# Patient Record
Sex: Male | Born: 1940 | Race: White | Hispanic: No | Smoking: Former smoker
Health system: Southern US, Community
[De-identification: ages and names within clinical notes are randomized; demographics above are authoritative.]

## PROBLEM LIST (undated history)

## (undated) DIAGNOSIS — H409 Unspecified glaucoma: Secondary | ICD-10-CM

## (undated) DIAGNOSIS — I10 Essential (primary) hypertension: Secondary | ICD-10-CM

## (undated) DIAGNOSIS — I2510 Atherosclerotic heart disease of native coronary artery without angina pectoris: Secondary | ICD-10-CM

## (undated) DIAGNOSIS — E785 Hyperlipidemia, unspecified: Secondary | ICD-10-CM

## (undated) DIAGNOSIS — I700 Atherosclerosis of aorta: Secondary | ICD-10-CM

## (undated) DIAGNOSIS — C61 Malignant neoplasm of prostate: Secondary | ICD-10-CM

## (undated) HISTORY — DX: Essential (primary) hypertension: I10

## (undated) HISTORY — DX: Hyperlipidemia, unspecified: E78.5

## (undated) HISTORY — PX: CORONARY ANGIOPLASTY WITH STENT PLACEMENT: SHX49

## (undated) HISTORY — DX: Atherosclerotic heart disease of native coronary artery without angina pectoris: I25.10

## (undated) HISTORY — PX: VEIN SURGERY: SHX48

## (undated) HISTORY — DX: Malignant neoplasm of prostate: C61

## (undated) HISTORY — DX: Atherosclerosis of aorta: I70.0

## (undated) HISTORY — PX: CORNEAL TRANSPLANT: SHX108

---

## 2015-01-02 HISTORY — PX: PROSTATECTOMY: SHX69

## 2018-01-06 DIAGNOSIS — Z7901 Long term (current) use of anticoagulants: Secondary | ICD-10-CM | POA: Diagnosis not present

## 2018-01-06 DIAGNOSIS — R1084 Generalized abdominal pain: Secondary | ICD-10-CM | POA: Diagnosis not present

## 2018-01-06 DIAGNOSIS — E7800 Pure hypercholesterolemia, unspecified: Secondary | ICD-10-CM | POA: Diagnosis not present

## 2018-01-06 DIAGNOSIS — I10 Essential (primary) hypertension: Secondary | ICD-10-CM | POA: Diagnosis not present

## 2018-01-06 DIAGNOSIS — R32 Unspecified urinary incontinence: Secondary | ICD-10-CM | POA: Diagnosis not present

## 2018-01-06 DIAGNOSIS — Z955 Presence of coronary angioplasty implant and graft: Secondary | ICD-10-CM | POA: Diagnosis not present

## 2018-01-06 DIAGNOSIS — E669 Obesity, unspecified: Secondary | ICD-10-CM | POA: Diagnosis not present

## 2018-01-06 DIAGNOSIS — Z6832 Body mass index (BMI) 32.0-32.9, adult: Secondary | ICD-10-CM | POA: Diagnosis not present

## 2018-01-06 DIAGNOSIS — S301XXA Contusion of abdominal wall, initial encounter: Secondary | ICD-10-CM | POA: Diagnosis not present

## 2018-01-06 DIAGNOSIS — I700 Atherosclerosis of aorta: Secondary | ICD-10-CM | POA: Diagnosis not present

## 2018-01-06 DIAGNOSIS — Z8546 Personal history of malignant neoplasm of prostate: Secondary | ICD-10-CM | POA: Diagnosis not present

## 2018-01-06 DIAGNOSIS — N200 Calculus of kidney: Secondary | ICD-10-CM | POA: Diagnosis not present

## 2018-01-07 DIAGNOSIS — H401131 Primary open-angle glaucoma, bilateral, mild stage: Secondary | ICD-10-CM | POA: Diagnosis not present

## 2018-01-27 DIAGNOSIS — C61 Malignant neoplasm of prostate: Secondary | ICD-10-CM | POA: Diagnosis not present

## 2018-01-27 DIAGNOSIS — R972 Elevated prostate specific antigen [PSA]: Secondary | ICD-10-CM | POA: Diagnosis not present

## 2018-01-27 DIAGNOSIS — N3289 Other specified disorders of bladder: Secondary | ICD-10-CM | POA: Diagnosis not present

## 2018-01-31 DIAGNOSIS — I10 Essential (primary) hypertension: Secondary | ICD-10-CM

## 2018-01-31 DIAGNOSIS — C61 Malignant neoplasm of prostate: Secondary | ICD-10-CM

## 2018-01-31 DIAGNOSIS — I119 Hypertensive heart disease without heart failure: Secondary | ICD-10-CM | POA: Insufficient documentation

## 2018-01-31 DIAGNOSIS — I2510 Atherosclerotic heart disease of native coronary artery without angina pectoris: Secondary | ICD-10-CM

## 2018-01-31 DIAGNOSIS — I700 Atherosclerosis of aorta: Secondary | ICD-10-CM

## 2018-01-31 DIAGNOSIS — E785 Hyperlipidemia, unspecified: Secondary | ICD-10-CM

## 2018-01-31 HISTORY — DX: Atherosclerosis of aorta: I70.0

## 2018-01-31 HISTORY — DX: Hyperlipidemia, unspecified: E78.5

## 2018-01-31 HISTORY — DX: Essential (primary) hypertension: I10

## 2018-01-31 HISTORY — DX: Atherosclerotic heart disease of native coronary artery without angina pectoris: I25.10

## 2018-01-31 HISTORY — DX: Malignant neoplasm of prostate: C61

## 2018-02-04 NOTE — Progress Notes (Signed)
Cardiology Office Note:    Date:  02/05/2018   ID:  Russell, Thomas 02-04-1940, MRN [id number]  PCP:  Lind Guest, NP  Cardiologist:  Shirlee More, MD   Referring MD: Lind Guest, NP  ASSESSMENT:    1. Coronary artery disease of native artery of native heart with stable angina pectoris ([place])   2. Atrial fibrillation, chronic   3. Chronic anticoagulation   4. Hypertensive heart disease without heart failure   5. Hyperlipidemia, unspecified hyperlipidemia type    PLAN:    In order of problems listed above:  1. CAD is clinically stable having no anginal discomfort but records are reviewed that he had multivessel CAD is at high risk of progressive disease and I think he should have an ischemia evaluation we discussed he agrees and will have a stress myocardial perfusion study.  If he has objective evidence of ischemia would benefit from further revascularization especially with severe left ventricular dysfunction.  We will also check echocardiogram if EF is truly severely reduced less than 35 to 40% we will transition from ACE inhibitor to [facility] start MRA needed discussion about the benefits of ICD. 2. Stable rate controlled without suppressant therapy unfortunately finances will force him to transition from Eliquis to warfarin we will give him samples to carry for 2 weeks until he is got about Pharm.D. 3. See above 4. Continue current medications await EF myocardial perfusion study likely discontinue his amlodipine and optimize treatment if EF remains less than 35 to 40% 5. Continue pravastatin  Next appointment 6 weeks   Medication Adjustments/Labs and Tests Ordered: Current medicines are reviewed at length with the patient today.  Concerns regarding medicines are outlined above.  No orders of the defined types were placed in this encounter.  No orders of the defined types were placed in this encounter.    No chief complaint on file.   History of Present Illness:      Thomas Russell is a 78 y.o. male who is being seen today for the evaluation of CAD at the request of Lind Guest, NP. Unfortunately is unable to review his records prior to the visit will allow the patient was present as able to access through care everywhere May 2017 he presented to the hospital in pulmonary edema rapid atrial fibrillation had non-ST elevation MI and had severely reduced ejection fraction coronary angiography showed multivessel CAD what was described as 50% left main proximal LAD and severe proximal right coronary artery he had PCI and stent to the right coronary.  Echocardiogram May 2018 shows an ejection fraction of 25 to 30% moderately to severely dilated left ventricle.  The gentleman is never had chest pain and despite severe left ventricular dysfunction does not have exercise intolerance edema shortness of breath palpitations syncope or TIA.  Unfortunately cannot afford Eliquis will give him samples to carry him until he seen by the Pharm.D. and we will refer him to the warfarin clinic he understands a higher risk of bleeding dietary restriction and need for frequent lab work at least in the beginning.  He has not had an ischemia evaluation that I can access with in his records.  He was unaware that he had multivessel CAD and severe left ventricular dysfunction.  His medical regimen consist of thiazide diuretic ACE inhibitor furosemide a apixaban and calcium channel blocker for hypertension he is not on beta-blocker.  Past Medical History:  Diagnosis Date  . Aortic atherosclerosis ([place]) 01/31/2018  . CAD (coronary  artery disease) 01/31/2018  . Hyperlipidemia 01/31/2018  . Hypertension 01/31/2018  . Prostate cancer ([place]) 01/31/2018    Past Surgical History:  Procedure Laterality Date  . CORONARY ANGIOPLASTY WITH STENT PLACEMENT    . VEIN SURGERY      Current Medications: Current Meds  Medication Sig  . amLODipine (NORVASC) 5 MG tablet Take 1 tablet by mouth daily.  Marland Kitchen  apixaban (ELIQUIS) 5 MG TABS tablet Take 5 mg by mouth 2 (two) times daily.  . bicalutamide (CASODEX) 50 MG tablet Take 50 mg by mouth daily.  . dorzolamide-timolol (COSOPT) 22.3-6.8 MG/ML ophthalmic solution Place 1 drop into both eyes 2 (two) times daily.  . fluorometholone (FML) 0.1 % ophthalmic suspension Place 1 drop into both eyes daily.  . furosemide (LASIX) 20 MG tablet Take 20 mg by mouth daily.  . hydrochlorothiazide (HYDRODIURIL) 12.5 MG tablet Take 12.5 mg by mouth daily.  Marland Kitchen latanoprost (XALATAN) 0.005 % ophthalmic solution Apply 1 drop to eye daily.  Marland Kitchen lisinopril (PRINIVIL,ZESTRIL) 20 MG tablet Take 20 mg by mouth daily.  . pravastatin (PRAVACHOL) 40 MG tablet Take 40 mg by mouth daily.     Allergies:   Alphagan [brimonidine]   Social History   Socioeconomic History  . Marital status: Unknown    Spouse name: Not on file  . Number of children: Not on file  . Years of education: Not on file  . Highest education level: Not on file  Occupational History  . Not on file  Social Needs  . Financial resource strain: Not on file  . Food insecurity:    Worry: Not on file    Inability: Not on file  . Transportation needs:    Medical: Not on file    Non-medical: Not on file  Tobacco Use  . Smoking status: Former Research scientist (life sciences)  . Smokeless tobacco: Former Network engineer and Sexual Activity  . Alcohol use: Not Currently  . Drug use: Never  . Sexual activity: Not on file  Lifestyle  . Physical activity:    Days per week: Not on file    Minutes per session: Not on file  . Stress: Not on file  Relationships  . Social connections:    Talks on phone: Not on file    Gets together: Not on file    Attends religious service: Not on file    Active member of club or organization: Not on file    Attends meetings of clubs or organizations: Not on file    Relationship status: Not on file  Other Topics Concern  . Not on file  Social History Narrative  . Not on file     Family  History: The patient's family history includes Cancer in his sister.  ROS:   Review of Systems  Constitution: Negative.  HENT: Negative.   Eyes: Negative.   Cardiovascular: Negative.   Respiratory: Negative.   Endocrine: Negative.   Hematologic/Lymphatic: Negative.   Skin: Negative.   Musculoskeletal: Negative.   Gastrointestinal: Negative.   Genitourinary: Negative.   Neurological: Negative.   Psychiatric/Behavioral: Negative.   Allergic/Immunologic: Negative.    Please see the history of present illness.     All other systems reviewed and are negative.  EKGs/Labs/Other Studies Reviewed:    The following studies were reviewed today:   EKG:  EKG is  ordered today.  The ekg ordered today demonstrates shows rate controlled atrial fibrillation ischemic T wave inversion 1 PVC  Recent Labs:   01/16/18 Hgb 12.3 plt  212000 No results found for requested labs within last 8760 hours.  Recent Lipid Panel No results found for: CHOL, TRIG, HDL, CHOLHDL, VLDL, LDLCALC, LDLDIRECT  Physical Exam:    VS:  BP (!) 136/50 (BP Location: Left Arm, Patient Position: Sitting, Cuff Size: Normal)   Pulse 67   Ht 6\' 1"  (1.854 m)   Wt 225 lb (102.1 kg)   SpO2 98%   BMI 29.69 kg/m     Wt Readings from Last 3 Encounters:  02/05/18 225 lb (102.1 kg)     GEN:  Well nourished, well developed in no acute distress HEENT: Normal NECK: No JVD; No carotid bruits LYMPHATICS: No lymphadenopathy CARDIAC: Irr irr variable s1  no murmurs, rubs, gallops RESPIRATORY:  Clear to auscultation without rales, wheezing or rhonchi  ABDOMEN: Soft, non-tender, non-distended MUSCULOSKELETAL:  No edema; No deformity  SKIN: Warm and dry NEUROLOGIC:  Alert and oriented x 3 PSYCHIATRIC:  Normal affect     Signed, Shirlee More, MD  02/05/2018 9:11 AM    [place]

## 2018-02-05 ENCOUNTER — Ambulatory Visit: Payer: Medicare HMO | Admitting: Cardiology

## 2018-02-05 ENCOUNTER — Encounter: Payer: Self-pay | Admitting: Cardiology

## 2018-02-05 VITALS — BP 136/50 | HR 67 | Ht 73.0 in | Wt 225.0 lb

## 2018-02-05 DIAGNOSIS — Z7901 Long term (current) use of anticoagulants: Secondary | ICD-10-CM | POA: Diagnosis not present

## 2018-02-05 DIAGNOSIS — E785 Hyperlipidemia, unspecified: Secondary | ICD-10-CM

## 2018-02-05 DIAGNOSIS — I482 Chronic atrial fibrillation, unspecified: Secondary | ICD-10-CM | POA: Diagnosis not present

## 2018-02-05 DIAGNOSIS — I119 Hypertensive heart disease without heart failure: Secondary | ICD-10-CM | POA: Diagnosis not present

## 2018-02-05 DIAGNOSIS — I25118 Atherosclerotic heart disease of native coronary artery with other forms of angina pectoris: Secondary | ICD-10-CM

## 2018-02-05 MED ORDER — APIXABAN 5 MG PO TABS: 5.0000 mg | ORAL_TABLET | Freq: Two times a day (BID) | ORAL | 2 refills | Status: DC

## 2018-02-05 MED ORDER — AMLODIPINE BESYLATE 5 MG PO TABS: 5.0000 mg | ORAL_TABLET | Freq: Every day | ORAL | 2 refills | Status: AC

## 2018-02-05 MED ORDER — PRAVASTATIN SODIUM 40 MG PO TABS: 40.0000 mg | ORAL_TABLET | Freq: Every day | ORAL | 2 refills | Status: AC

## 2018-02-05 MED ORDER — FUROSEMIDE 20 MG PO TABS: 20.0000 mg | ORAL_TABLET | Freq: Every day | ORAL | 2 refills | Status: AC

## 2018-02-05 MED ORDER — LISINOPRIL 20 MG PO TABS: 20.0000 mg | ORAL_TABLET | Freq: Every day | ORAL | 2 refills | Status: AC

## 2018-02-05 MED ORDER — HYDROCHLOROTHIAZIDE 12.5 MG PO TABS: 12.5000 mg | ORAL_TABLET | Freq: Every day | ORAL | 3 refills | Status: AC

## 2018-02-05 NOTE — Patient Instructions (Signed)
Medication Instructions:   Your physician recommends that you continue on your current medications as directed. Please refer to the Current Medication list given to you today.   If you need a refill on your cardiac medications before your next appointment, please call your pharmacy.   Lab work:  NONE  If you have labs (blood work) drawn today and your tests are completely normal, you will receive your results only by: Marland Kitchen MyChart Message (if you have MyChart) OR . A paper copy in the mail If you have any lab test that is abnormal or we need to change your treatment, we will call you to review the results.  Testing/Procedures:  Your physician has requested that you have en exercise stress myoview. For further information please visit [email or web address]. Please follow instruction sheet, as given.  Your physician has requested that you have an echocardiogram. Echocardiography is a painless test that uses sound waves to create images of your heart. It provides your doctor with information about the size and shape of your heart and how well your heart's chambers and valves are working. This procedure takes approximately one hour. There are no restrictions for this procedure.    Follow-Up: At [facility], you and your health needs are our priority.  As part of our continuing mission to provide you with exceptional heart care, we have created designated Provider Care Teams.  These Care Teams include your primary Cardiologist (physician) and Advanced Practice Providers (APPs -  Physician Assistants and Nurse Practitioners) who all work together to provide you with the care you need, when you need it.  You will need a follow up appointment in 6 weeks.

## 2018-02-05 NOTE — Addendum Note (Signed)
Addended by: Orland Penman on: 02/05/2018 10:39 AM   Modules accepted: Orders

## 2018-02-11 ENCOUNTER — Telehealth: Payer: Self-pay | Admitting: Cardiology

## 2018-02-11 NOTE — Telephone Encounter (Signed)
Has questions about starting Warfrin

## 2018-02-11 NOTE — Telephone Encounter (Signed)
Called patient to discuss changing his anticoagulation from eliquis to coumadin due to finances. During his last office visit on 02/05/2018, Dr. Bettina Gavia states that patient should be set up with our coumadin clinic to transition to coumadin. Patient has eliquis samples to get him to Wednesday, 02/19/2018. Patient has been scheduled to see the coumadin clinic on Tuesday, 02/18/2018. Patient will start coumadin 5 mg daily per Dr. Bettina Gavia as instructed below.  Spoke with Tula Nakayama with the coumadin clinic to discuss further instructions. Patient is to take eliquis and coumadin 5 mg together on Friday evening, 02/14/2018, Saturday, 02/15/2018, and Sunday, 02/16/2018. Patient will continue to take eliquis 5 mg in the morning as well. Patient instructed to discontinue eliquis on Sunday after his evening dose. He will only take 5 mg of coumadin on Monday, 02/17/2018, and will recheck his INR on Tuesday, 02/18/2018, per Tula Nakayama, RN. Patient verbalized understanding and requested a copy of these instructions to be faxed to the [facility] office for him to pick up. Will arrange. No further questions.

## 2018-02-12 ENCOUNTER — Telehealth: Payer: Self-pay | Admitting: Cardiology

## 2018-02-12 MED ORDER — WARFARIN SODIUM 5 MG PO TABS: 5.0000 mg | ORAL_TABLET | Freq: Every day | ORAL | 1 refills | Status: DC

## 2018-02-12 NOTE — Telephone Encounter (Signed)
°*  STAT* If patient is at the pharmacy, call can be transferred to refill team.   1. Which medications need to be refilled? (please list name of each medication and dose if known) Warfarin   2. Which pharmacy/location (including street and city if local pharmacy) is medication to be sent to?[facility] [address]  3. Do they need a 30 day or 90 day supply? [place]

## 2018-02-12 NOTE — Telephone Encounter (Signed)
Rx sent in for wafarin 5mg  one tablet daily as directed by Dr Joya Gaskins verbal order.  This is new start for warfarin, and will be managed by Coumadin Clinic.

## 2018-02-18 ENCOUNTER — Ambulatory Visit (INDEPENDENT_AMBULATORY_CARE_PROVIDER_SITE_OTHER): Payer: Medicare HMO | Admitting: *Deleted

## 2018-02-18 DIAGNOSIS — Z7901 Long term (current) use of anticoagulants: Secondary | ICD-10-CM | POA: Diagnosis not present

## 2018-02-18 DIAGNOSIS — I482 Chronic atrial fibrillation, unspecified: Secondary | ICD-10-CM

## 2018-02-18 LAB — POCT INR: INR: 2.1 (ref 2.0–3.0)

## 2018-02-18 NOTE — Patient Instructions (Addendum)
A full discussion of the nature of anticoagulants has been carried out.  A benefit risk analysis has been presented to the patient, so that they understand the justification for choosing anticoagulation at this time. The need for frequent and regular monitoring, precise dosage adjustment and compliance is stressed.  Side effects of potential bleeding are discussed.  The patient should avoid any OTC items containing aspirin or ibuprofen, and should avoid great swings in general diet.  Avoid alcohol consumption.  Call if any signs of abnormal bleeding.   Description   Start taking 1 pill everyday except 1/2 pill  On Wednesday and Saturday. Recheck in one week. Call us with any medication changes or concerns # [phone]

## 2018-02-24 DIAGNOSIS — E785 Hyperlipidemia, unspecified: Secondary | ICD-10-CM | POA: Diagnosis not present

## 2018-02-24 DIAGNOSIS — I739 Peripheral vascular disease, unspecified: Secondary | ICD-10-CM | POA: Diagnosis not present

## 2018-02-24 DIAGNOSIS — G3184 Mild cognitive impairment, so stated: Secondary | ICD-10-CM | POA: Diagnosis not present

## 2018-02-24 DIAGNOSIS — C61 Malignant neoplasm of prostate: Secondary | ICD-10-CM | POA: Diagnosis not present

## 2018-02-24 DIAGNOSIS — K08409 Partial loss of teeth, unspecified cause, unspecified class: Secondary | ICD-10-CM | POA: Diagnosis not present

## 2018-02-24 DIAGNOSIS — I252 Old myocardial infarction: Secondary | ICD-10-CM | POA: Diagnosis not present

## 2018-02-24 DIAGNOSIS — H409 Unspecified glaucoma: Secondary | ICD-10-CM | POA: Diagnosis not present

## 2018-02-24 DIAGNOSIS — I10 Essential (primary) hypertension: Secondary | ICD-10-CM | POA: Diagnosis not present

## 2018-02-24 DIAGNOSIS — N529 Male erectile dysfunction, unspecified: Secondary | ICD-10-CM | POA: Diagnosis not present

## 2018-02-24 DIAGNOSIS — E669 Obesity, unspecified: Secondary | ICD-10-CM | POA: Diagnosis not present

## 2018-02-25 ENCOUNTER — Ambulatory Visit (INDEPENDENT_AMBULATORY_CARE_PROVIDER_SITE_OTHER): Payer: Medicare HMO

## 2018-02-25 ENCOUNTER — Ambulatory Visit (INDEPENDENT_AMBULATORY_CARE_PROVIDER_SITE_OTHER): Payer: Medicare HMO | Admitting: Pharmacist Clinician (PhC)/ Clinical Pharmacy Specialist

## 2018-02-25 DIAGNOSIS — Z7901 Long term (current) use of anticoagulants: Secondary | ICD-10-CM

## 2018-02-25 DIAGNOSIS — I482 Chronic atrial fibrillation, unspecified: Secondary | ICD-10-CM

## 2018-02-25 LAB — POCT INR: INR: 4 — AB (ref 2.0–3.0)

## 2018-02-25 NOTE — Patient Instructions (Signed)
No warfarin today Tuesday Feb 25, then take 1 tablet daily except 1/2 tablet each Monday, Wednesday and Friday.   Recheck in one week. Call us with any medication changes or concerns # [phone]

## 2018-03-04 ENCOUNTER — Telehealth: Payer: Self-pay | Admitting: *Deleted

## 2018-03-04 ENCOUNTER — Ambulatory Visit (INDEPENDENT_AMBULATORY_CARE_PROVIDER_SITE_OTHER): Payer: Medicare HMO | Admitting: Pharmacist

## 2018-03-04 DIAGNOSIS — I482 Chronic atrial fibrillation, unspecified: Secondary | ICD-10-CM

## 2018-03-04 DIAGNOSIS — Z7901 Long term (current) use of anticoagulants: Secondary | ICD-10-CM | POA: Diagnosis not present

## 2018-03-04 LAB — POCT INR: INR: 3.8 — AB (ref 2.0–3.0)

## 2018-03-04 NOTE — Telephone Encounter (Signed)
Patient given detailed instructions per Myocardial Perfusion Study Information Sheet for the test on 03/11/18 at 0800. Patient notified to arrive 15 minutes early and that it is imperative to arrive on time for appointment to keep from having the test rescheduled.  If you need to cancel or reschedule your appointment, please call the office within 24 hours of your appointment. . Patient verbalized understanding.Thomas Russell, Ranae Palms

## 2018-03-04 NOTE — Patient Instructions (Signed)
Description   No warfarin today March 3rd, then start taking 1/2 tablet daily except 1 on Sundays.   Recheck in one week. Call us with any medication changes or concerns # [phone]

## 2018-03-11 ENCOUNTER — Ambulatory Visit (INDEPENDENT_AMBULATORY_CARE_PROVIDER_SITE_OTHER): Payer: Medicare HMO

## 2018-03-11 ENCOUNTER — Telehealth: Payer: Self-pay

## 2018-03-11 ENCOUNTER — Ambulatory Visit (INDEPENDENT_AMBULATORY_CARE_PROVIDER_SITE_OTHER): Payer: Medicare HMO | Admitting: *Deleted

## 2018-03-11 VITALS — Ht 73.0 in | Wt 225.0 lb

## 2018-03-11 DIAGNOSIS — I482 Chronic atrial fibrillation, unspecified: Secondary | ICD-10-CM

## 2018-03-11 DIAGNOSIS — Z7901 Long term (current) use of anticoagulants: Secondary | ICD-10-CM

## 2018-03-11 DIAGNOSIS — I25118 Atherosclerotic heart disease of native coronary artery with other forms of angina pectoris: Secondary | ICD-10-CM

## 2018-03-11 DIAGNOSIS — I119 Hypertensive heart disease without heart failure: Secondary | ICD-10-CM | POA: Diagnosis not present

## 2018-03-11 DIAGNOSIS — E785 Hyperlipidemia, unspecified: Secondary | ICD-10-CM

## 2018-03-11 DIAGNOSIS — I2510 Atherosclerotic heart disease of native coronary artery without angina pectoris: Secondary | ICD-10-CM

## 2018-03-11 LAB — MYOCARDIAL PERFUSION IMAGING
Peak HR: 93 {beats}/min
Rest HR: 59 {beats}/min
SDS: 10
SRS: 7
SSS: 17
TID: 1.02

## 2018-03-11 LAB — ECHOCARDIOGRAM COMPLETE
Height: 73 in
Weight: 3600 oz

## 2018-03-11 LAB — POCT INR: INR: 2.4 (ref 2.0–3.0)

## 2018-03-11 MED ORDER — REGADENOSON 0.4 MG/5ML IV SOLN
0.4000 mg | Freq: Once | INTRAVENOUS | Status: AC
  Administered 2018-03-11: 0.4 mg via INTRAVENOUS

## 2018-03-11 MED ORDER — TECHNETIUM TC 99M TETROFOSMIN IV KIT
32.6000 | PACK | Freq: Once | INTRAVENOUS | Status: AC | PRN
  Administered 2018-03-11: 32.6 via INTRAVENOUS

## 2018-03-11 MED ORDER — TECHNETIUM TC 99M TETROFOSMIN IV KIT
10.9000 | PACK | Freq: Once | INTRAVENOUS | Status: AC | PRN
  Administered 2018-03-11: 10.9 via INTRAVENOUS

## 2018-03-11 NOTE — Progress Notes (Signed)
Complete echocardiogram has been performed.  Jimmy Vidit Boissonneault RDCS, RVT 

## 2018-03-11 NOTE — Telephone Encounter (Signed)
Patient notified of improved echocardiogram results per Dr Bettina Gavia.  Patient advised that myoview does show some ischemia, and Dr Bettina Gavia would like to discuss these results in more detail at next appointment.  Patient advised to keep follow up appointment for 03-26-2018. Patient agreed to plan and verbalized understanding.

## 2018-03-11 NOTE — Patient Instructions (Signed)
Description   Continue  taking 1/2 tablet daily except 1 tablet on Sundays.   Recheck in 2 weeks. Call us with any medication changes or concerns # [phone]

## 2018-03-12 ENCOUNTER — Other Ambulatory Visit: Payer: Medicare HMO

## 2018-03-13 DIAGNOSIS — N393 Stress incontinence (female) (male): Secondary | ICD-10-CM | POA: Diagnosis not present

## 2018-03-13 DIAGNOSIS — C61 Malignant neoplasm of prostate: Secondary | ICD-10-CM | POA: Diagnosis not present

## 2018-03-19 ENCOUNTER — Other Ambulatory Visit (HOSPITAL_COMMUNITY): Payer: Self-pay | Admitting: Urology

## 2018-03-19 DIAGNOSIS — C61 Malignant neoplasm of prostate: Secondary | ICD-10-CM

## 2018-03-25 ENCOUNTER — Other Ambulatory Visit: Payer: Self-pay

## 2018-03-25 ENCOUNTER — Ambulatory Visit (INDEPENDENT_AMBULATORY_CARE_PROVIDER_SITE_OTHER): Payer: Medicare HMO | Admitting: Pharmacist

## 2018-03-25 DIAGNOSIS — I482 Chronic atrial fibrillation, unspecified: Secondary | ICD-10-CM | POA: Diagnosis not present

## 2018-03-25 DIAGNOSIS — Z7901 Long term (current) use of anticoagulants: Secondary | ICD-10-CM | POA: Diagnosis not present

## 2018-03-25 LAB — POCT INR: INR: 1.9 — AB (ref 2.0–3.0)

## 2018-03-25 NOTE — Patient Instructions (Addendum)
Description   Take 1 tablet today then continue Continue  taking 1/2 tablet daily except 1 tablet on Sundays.   Recheck in 2 weeks. Call us with any medication changes or concerns # [phone]    Please follow up with primary care doctor about blood in your urine

## 2018-03-26 ENCOUNTER — Telehealth (INDEPENDENT_AMBULATORY_CARE_PROVIDER_SITE_OTHER): Payer: Medicare HMO | Admitting: Cardiology

## 2018-03-26 ENCOUNTER — Telehealth: Payer: Self-pay | Admitting: Cardiology

## 2018-03-26 ENCOUNTER — Encounter: Payer: Self-pay | Admitting: Cardiology

## 2018-03-26 VITALS — BP 145/54 | HR 77 | Ht 72.0 in | Wt 225.0 lb

## 2018-03-26 DIAGNOSIS — Z955 Presence of coronary angioplasty implant and graft: Secondary | ICD-10-CM | POA: Diagnosis not present

## 2018-03-26 DIAGNOSIS — I5020 Unspecified systolic (congestive) heart failure: Secondary | ICD-10-CM

## 2018-03-26 DIAGNOSIS — Z7901 Long term (current) use of anticoagulants: Secondary | ICD-10-CM

## 2018-03-26 DIAGNOSIS — E785 Hyperlipidemia, unspecified: Secondary | ICD-10-CM | POA: Diagnosis not present

## 2018-03-26 DIAGNOSIS — I110 Hypertensive heart disease with heart failure: Secondary | ICD-10-CM

## 2018-03-26 DIAGNOSIS — I25118 Atherosclerotic heart disease of native coronary artery with other forms of angina pectoris: Secondary | ICD-10-CM

## 2018-03-26 DIAGNOSIS — Z79899 Other long term (current) drug therapy: Secondary | ICD-10-CM | POA: Diagnosis not present

## 2018-03-26 DIAGNOSIS — I482 Chronic atrial fibrillation, unspecified: Secondary | ICD-10-CM

## 2018-03-26 DIAGNOSIS — I119 Hypertensive heart disease without heart failure: Secondary | ICD-10-CM

## 2018-03-26 DIAGNOSIS — I2510 Atherosclerotic heart disease of native coronary artery without angina pectoris: Secondary | ICD-10-CM

## 2018-03-26 DIAGNOSIS — R0602 Shortness of breath: Secondary | ICD-10-CM

## 2018-03-26 NOTE — Telephone Encounter (Signed)
Documented in patients chart on televisit from today.

## 2018-03-26 NOTE — Progress Notes (Signed)
Virtual Visit via Telephone Note    Evaluation Performed:  Follow-up visit  This visit type was conducted due to national recommendations for restrictions regarding the COVID-19 Pandemic (e.g. social distancing).  This format is felt to be most appropriate for this patient at this time.  All issues noted in this document were discussed and addressed.  No physical exam was performed (except for noted visual exam findings with Video Visits).  Please refer to the patient's chart (MyChart message for video visits and phone note for telephone visits) for the patient's consent to telehealth for [facility].  Date:  03/26/2018   ID:  [name], [name] 1940/04/21, MRN [id number]  Patient Location:  [address] [place] [address]   Provider location:   [place] office  PCP:  Thomas Guest, NP  Cardiologist:  No primary care provider on file.  Dr. Bettina Gavia Electrophysiologist:  None   Chief Complaint: Follow-up atrial fibrillation heart failure CAD  History of Present Illness:    Thomas Russell is a 78 y.o. male who presents via Engineer, civil (consulting) for a telehealth visit today.  He was last seen 02/05/2018  The patient does not symptoms concerning for COVID-19 infection (fever, chills, cough, or new SHORTNESS OF BREATH).  May 2017 he presented to the hospital in pulmonary edema rapid atrial fibrillation had non-ST elevation MI and had severely reduced ejection fraction coronary angiography showed multivessel CAD what was described as 50% left main proximal LAD and severe proximal right coronary artery he had PCI and multiple stent to the right coronary. Echocardiogram May 2018 shows an ejection fraction of 25 to 30% moderately to severely dilated left ventricle.    Prior CV studies:   The following studies were reviewed today: Echocardiogram after that visit shows an ejection fraction 45 to 50% moderately dilated ventricle with moderate concentric left  ventricular hypertrophy.  There is mild to moderate mitral regurgitation mild to moderate aortic regurgitation was noted.  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  On 03/11/2018 he had a myocardial perfusion study performed ejection fraction could not be determined because of PVCs and he had evidence of ischemia in the distribution of the right coronary artery.  He had an INR performed yesterday 1.9 managed on warfarin clinic  Overall he feels good he has mild shortness of breath when he is outdoors doing activities like shopping New York Heart Association class II but no edema orthopnea chest pain palpitation syncope or TIA.  He describes a brief episode of hematuria 2 weeks ago no recurrence.  At this time I would not interrupt his anticoagulant refer to urology with her coronavirus outbreak.  He does sodium restrict said he will attempt to find his blood pressure cuff and if he does will call our office today with heart rate and blood pressure.  I reviewed his abnormal myocardial perfusion study my recommendation to continue medical treatment at this time he is not having angina and his market improvement in left ventricular ejection function function.   Past Medical History:  Diagnosis Date   Aortic atherosclerosis ([place]) 01/31/2018   CAD (coronary artery disease) 01/31/2018   Hyperlipidemia 01/31/2018   Hypertension 01/31/2018   Prostate cancer ([place]) 01/31/2018   Past Surgical History:  Procedure Laterality Date   CORONARY ANGIOPLASTY WITH STENT PLACEMENT     VEIN SURGERY       Current Meds  Medication Sig   amLODipine (NORVASC) 5 MG tablet Take 1  tablet (5 mg total) by mouth daily.   bicalutamide (CASODEX) 50 MG tablet Take 50 mg by mouth daily.   dorzolamide-timolol (COSOPT) 22.3-6.8 MG/ML ophthalmic solution Place 1 drop into both eyes 2 (two) times daily.   fluorometholone (FML) 0.1 % ophthalmic suspension Place 1 drop into both eyes daily.   furosemide (LASIX) 20 MG tablet Take  1 tablet (20 mg total) by mouth daily.   hydrochlorothiazide (HYDRODIURIL) 12.5 MG tablet Take 1 tablet (12.5 mg total) by mouth daily.   latanoprost (XALATAN) 0.005 % ophthalmic solution Apply 1 drop to eye daily.   lisinopril (PRINIVIL,ZESTRIL) 20 MG tablet Take 1 tablet (20 mg total) by mouth daily.   pravastatin (PRAVACHOL) 40 MG tablet Take 1 tablet (40 mg total) by mouth daily.   warfarin (COUMADIN) 5 MG tablet Take 1 tablet (5 mg total) by mouth daily.     Allergies:   Alphagan [brimonidine]   Social History   Tobacco Use   Smoking status: Former Smoker   Smokeless tobacco: Former Network engineer Use Topics   Alcohol use: Not Currently   Drug use: Never     Family Hx: The patient's family history includes Cancer in his sister.  ROS:   Please see the history of present illness.     All other systems reviewed and are negative.   Labs/Other Tests and Data Reviewed:    Recent Labs: No results found for requested labs within last 8760 hours.   Recent Lipid Panel No results found for: CHOL, TRIG, HDL, CHOLHDL, LDLCALC, LDLDIRECT  Wt Readings from Last 3 Encounters:  03/26/18 225 lb (102.1 kg)  03/11/18 225 lb (102.1 kg)  02/05/18 225 lb (102.1 kg)     Exam:    Vital Signs:  Ht 6' (1.829 m)    Wt 225 lb (102.1 kg)    BMI 30.52 kg/m    Unfortunately he is in the process of moving and cannot access his blood pressure cuff he is weighing daily and his weights are down 2 to 3 pounds at home ASSESSMENT & PLAN:    1.  Atrial fibrillation chronic rate is controlled without suppressant therapy hopefully he will call our office of the heart rate today continue his anticoagulation warfarin through our Coumadin clinic if he has recurrent hematuria he will need evaluation to urology despite the current coronavirus outbreak 2.  Chronic anticoagulation continue warfarin see above INR goal 2.5 3.  CAD stable New York Heart Association class I at this time I would not  refer for coronary angiography and continue medical treatment including calcium channel blocker statin and anticoagulant in my opinion he did not have a high risk myocardial perfusion study he had single-vessel ischemia in the distribution of right coronary artery with multiple stents in the past. 4.  Hypertension continue current treatment hopefully will call our office with a home blood pressure goal 1 14-7 40 systolic 5.  Hyperlipidemia stable continue statin with CAD  COVID-19 Education: The signs and symptoms of COVID-19 were discussed with the patient and how to seek care for testing (follow up with PCP or arrange E-visit).  The importance of social distancing was discussed today.  Patient Risk:   After full review of this patients clinical status, I feel that they are at least moderate risk at this time.  Time:   Today, I have spent 23 minutes minutes with the patient with telehealth technology discussing the results of his testing echocardiogram myocardial perfusion study pro time INR yesterday  medical treatment and necessity for him to begin to home monitor blood pressure and heart rate weigh daily he alerted me that he had brief hematuria 2 weeks ago at this time I would not initiate a urologic evaluation until recurrent or interrupt his anticoagulants.  We will time checking labs BMP proBNP when he returns for an INR in 2 weeks to the warfarin clinic.     Medication Adjustments/Labs and Tests Ordered: Current medicines are reviewed at length with the patient today.  Concerns regarding medicines are outlined above.  Tests Ordered: No orders of the defined types were placed in this encounter.  Medication Changes: No orders of the defined types were placed in this encounter.   Disposition:  in 3 month(s)  Signed, Shirlee More, MD  03/26/2018 9:26 AM    [facility]

## 2018-03-26 NOTE — Patient Instructions (Signed)
Medication Instructions:   Your physician recommends that you continue on your current medications as directed. Please refer to the Current Medication list given to you today.  If you need a refill on your cardiac medications before your next appointment, please call your pharmacy.   Lab work: Your physician recommends that you return for lab work on Tuesday 04/15/2018 in the [place] office for your INR check. You will have the following additional labs drawn:  BMP Pro BNP  If you have labs (blood work) drawn today and your tests are completely normal, you will receive your results only by: Marland Kitchen MyChart Message (if you have MyChart) OR . A paper copy in the mail If you have any lab test that is abnormal or we need to change your treatment, we will call you to review the results.  Testing/Procedures:  None  Follow-Up: At [facility], you and your health needs are our priority.  As part of our continuing mission to provide you with exceptional heart care, we have created designated Provider Care Teams.  These Care Teams include your primary Cardiologist (physician) and Advanced Practice Providers (APPs -  Physician Assistants and Nurse Practitioners) who all work together to provide you with the care you need, when you need it. . You will need a follow up appointment in 3 months.   Any Other Special Instructions Will Be Listed Below (If Applicable).

## 2018-03-26 NOTE — Telephone Encounter (Signed)
Patient told to call his BP and pulse back to [place]: 145/54 with pulse of 77 at lunchtime

## 2018-04-04 ENCOUNTER — Other Ambulatory Visit: Payer: Self-pay | Admitting: Cardiology

## 2018-04-08 ENCOUNTER — Other Ambulatory Visit: Payer: Self-pay

## 2018-04-08 ENCOUNTER — Ambulatory Visit (HOSPITAL_COMMUNITY)
Admission: RE | Admit: 2018-04-08 | Discharge: 2018-04-08 | Disposition: A | Discharge status: Home / Self Care | Payer: Medicare HMO | Source: Ambulatory Visit | Attending: Urology | Admitting: Urology

## 2018-04-08 DIAGNOSIS — C61 Malignant neoplasm of prostate: Secondary | ICD-10-CM | POA: Insufficient documentation

## 2018-04-08 MED ORDER — AXUMIN (FLUCICLOVINE F 18) INJECTION
10.6100 | Freq: Once | INTRAVENOUS | Status: AC | PRN
  Administered 2018-04-08: 10.61 via INTRAVENOUS

## 2018-04-14 ENCOUNTER — Telehealth: Payer: Medicare HMO | Admitting: Cardiology

## 2018-04-14 ENCOUNTER — Telehealth: Payer: Self-pay

## 2018-04-14 NOTE — Telephone Encounter (Signed)

## 2018-04-15 ENCOUNTER — Ambulatory Visit (INDEPENDENT_AMBULATORY_CARE_PROVIDER_SITE_OTHER): Payer: Medicare HMO | Admitting: *Deleted

## 2018-04-15 ENCOUNTER — Other Ambulatory Visit: Payer: Self-pay

## 2018-04-15 DIAGNOSIS — Z7901 Long term (current) use of anticoagulants: Secondary | ICD-10-CM | POA: Diagnosis not present

## 2018-04-15 DIAGNOSIS — I482 Chronic atrial fibrillation, unspecified: Secondary | ICD-10-CM | POA: Diagnosis not present

## 2018-04-15 LAB — POCT INR: INR: 2.7 (ref 2.0–3.0)

## 2018-04-15 NOTE — Patient Instructions (Signed)
Description   Spoke with pt and instructed pt to continue taking 1/2 tablet daily except 1 tablet on Sundays.   Recheck in 4 weeks. Call us with any medication changes or concerns # [phone]

## 2018-04-22 DIAGNOSIS — C61 Malignant neoplasm of prostate: Secondary | ICD-10-CM | POA: Diagnosis not present

## 2018-04-22 DIAGNOSIS — C774 Secondary and unspecified malignant neoplasm of inguinal and lower limb lymph nodes: Secondary | ICD-10-CM | POA: Diagnosis not present

## 2018-04-25 ENCOUNTER — Telehealth: Payer: Self-pay

## 2018-04-25 DIAGNOSIS — R310 Gross hematuria: Secondary | ICD-10-CM | POA: Diagnosis not present

## 2018-04-25 DIAGNOSIS — C61 Malignant neoplasm of prostate: Secondary | ICD-10-CM | POA: Diagnosis not present

## 2018-04-25 DIAGNOSIS — N481 Balanitis: Secondary | ICD-10-CM | POA: Diagnosis not present

## 2018-04-25 NOTE — Telephone Encounter (Signed)
Thomas Russell from [facility] calling per Dr. Nila Nephew request to know what anticoagulion therapy is on and what dose. Explained that he's on coumadin which is adjusted frequently by coumadin clinic. Our records may not be up to date, so they should contact [facility] for current dosage. Verbalizes understanding and has no further questions or concerns.

## 2018-04-29 DIAGNOSIS — C775 Secondary and unspecified malignant neoplasm of intrapelvic lymph nodes: Secondary | ICD-10-CM | POA: Diagnosis not present

## 2018-04-29 DIAGNOSIS — C61 Malignant neoplasm of prostate: Secondary | ICD-10-CM | POA: Diagnosis not present

## 2018-05-07 ENCOUNTER — Encounter: Payer: Self-pay | Admitting: Radiation Oncology

## 2018-05-07 NOTE — Progress Notes (Signed)
GU Location of Tumor / Histology: prostatic adenocarcinoma  If Prostate Cancer, Gleason Score is (4 + 5).  Thomas Russell had a radical prostatectomy 08/2015 and was started on Lupron shortly thereafter. Patient completed XRT June 2018. Bicalutamide added but psa 03/2018 was 0.7 so patient was switched to enzulutamide. Recent PET revealed two hypermetabolic lymph nodes.     Past/Anticipated interventions by urology, if any: prostatectomy  Past/Anticipated interventions by medical oncology, if any: Lupron, bicalutamide, enzulutamide. Reports gross hematuria. Reports swollen penile head.  Weight changes, if any: Some weight gain due to being stuck in the house.  Bowel/Bladder complaints, if any: Urinary incontinence requiring him to wear a pad.     Nausea/Vomiting, if any: No  Pain issues, if any: No  SAFETY ISSUES:  Prior radiation? yes, 2018 in [facility]  Pacemaker/ICD? No  Possible current pregnancy? no, male patient  Is the patient on methotrexate?   Current Complaints / other details:  78 year old male. Resides in a senior living facility. Married three times. Third wife deceased. Has one child and two grandchildren. 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 Former smoker and weekend drinker.   Needs radiation to left pelvic side wall.

## 2018-05-09 ENCOUNTER — Ambulatory Visit
Admission: RE | Admit: 2018-05-09 | Discharge: 2018-05-09 | Disposition: A | Discharge status: Home / Self Care | Payer: Medicare HMO | Source: Ambulatory Visit | Attending: Radiation Oncology | Admitting: Radiation Oncology

## 2018-05-09 ENCOUNTER — Encounter: Payer: Self-pay | Admitting: Radiation Oncology

## 2018-05-09 ENCOUNTER — Other Ambulatory Visit: Payer: Self-pay

## 2018-05-09 VITALS — Ht 72.0 in | Wt 225.0 lb

## 2018-05-09 DIAGNOSIS — R9721 Rising PSA following treatment for malignant neoplasm of prostate: Secondary | ICD-10-CM | POA: Diagnosis not present

## 2018-05-09 DIAGNOSIS — Z9079 Acquired absence of other genital organ(s): Secondary | ICD-10-CM | POA: Diagnosis not present

## 2018-05-09 DIAGNOSIS — C61 Malignant neoplasm of prostate: Secondary | ICD-10-CM

## 2018-05-09 DIAGNOSIS — C775 Secondary and unspecified malignant neoplasm of intrapelvic lymph nodes: Secondary | ICD-10-CM

## 2018-05-09 DIAGNOSIS — Z79818 Long term (current) use of other agents affecting estrogen receptors and estrogen levels: Secondary | ICD-10-CM | POA: Diagnosis not present

## 2018-05-09 HISTORY — DX: Unspecified glaucoma: H40.9

## 2018-05-09 NOTE — Progress Notes (Signed)
Radiation Oncology         [phone] ________________________________  Initial Telephone Consultation  Name: Thomas Russell MRN: [id number]  Date: 05/09/2018  DOB: Nov 11, 1940  DG:UYQI, Thomas Lacks, NP  Marice Potter, MD   REFERRING PHYSICIAN: Marice Potter, MD  DIAGNOSIS: 78 y.o. 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    ICD-10-CM   1. Prostate cancer ([place]) C61   2. 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 He has been followed by Dr. Nila Nephew at [facility] for prostate cancer since 2017. He was initially treated with radical rootic assisted prostatectomy in [place] in 08/2015, with pathology revealing Stage IVa (T4 N1 M0) Gleason 4+5 disease involving the left pelvic side wall and 1/4 nodes positive with ECE, positive margins, PNI and LVI with PSA of 22.68 at the time of diagnosis. His initial post-op PSA was 0.09 but given his adverse pathology, he began Lupron shortly after, combined with adjuvant external beam radiation.  Adjuvant XRT to the prostate fossa and pelvic lymph nodes was completed in [place] with Dr. Agnes Lawrence in June 2018.  His PSA initially responded well but unfortunately, over the last 6 months, his PSA became detectable again at 0.49 in 06/2017 so bicalutamide was added to his treatment. Repeat PSA in 03/2018 was further elevated at 0.7 despite biclutamide so he was referred to Dr. Bobby Rumpf and switched to Enzalutamide at that time.  An Axumin PET scan performed on 04/08/2018 revealed: 1.3 cm left internal iliac and 7 mm left external iliac lymph nodes with uptake greater than bone marrow activity, consistent with metastatic disease; but no other sites  of metastatic disease identified.  His most recent PSA on 04/22/2018 shows improvement to 0.3 since starting Enzalutamide and he is tolerating this well in combination with the Lupron.  The patient reviewed the recent labs and PETscan results with Dr. Bobby Rumpf and he has kindly been referred today for discussion of potential further salvage radiation treatment options.  PREVIOUS RADIATION THERAPY: Yes  05/2016 - 06/2016: Prostate Fossa and nodes treated in [place] with Dr. Rosario Jacks  PAST MEDICAL HISTORY:  Past Medical History:  Diagnosis Date  . Aortic atherosclerosis ([place]) 01/31/2018  . CAD (coronary artery disease) 01/31/2018  . Glaucoma   . Hyperlipidemia 01/31/2018  . Hypertension 01/31/2018  . Prostate cancer ([place]) 01/31/2018      PAST SURGICAL HISTORY: Past Surgical History:  Procedure Laterality Date  . CORNEAL TRANSPLANT Bilateral   . CORONARY ANGIOPLASTY WITH STENT PLACEMENT    . PROSTATECTOMY  2017  . VEIN SURGERY      FAMILY HISTORY:  Family History  Problem Relation Age of Onset  . Heart attack Mother   . Heart attack Father   . Cancer Sister     SOCIAL HISTORY:  Social History   Socioeconomic History  . Marital status: Widowed    Spouse name: Not on file  . Number of children: 1  . Years of education: Not on file  . Highest education level: Not on file  Occupational History  . Not on file  Social Needs  . Financial resource strain: Not on file  . Food insecurity:    Worry: Not on file  Inability: Not on file  . Transportation needs:    Medical: No    Non-medical: No  Tobacco Use  . Smoking status: Former Research scientist (life sciences)  . Smokeless tobacco: Former Network engineer and Sexual Activity  . Alcohol use: Not Currently  . Drug use: Never  . Sexual activity: Not Currently  Lifestyle  . Physical activity:    Days per week: Not on file    Minutes per session: Not on file  . Stress: Not on file  Relationships  . Social connections:    Talks on phone: Not on  file    Gets together: Not on file    Attends religious service: Not on file    Active member of club or organization: Not on file    Attends meetings of clubs or organizations: Not on file    Relationship status: Not on file  . Intimate partner violence:    Fear of current or ex partner: Not on file    Emotionally abused: Not on file    Physically abused: Not on file    Forced sexual activity: Not on file  Other Topics Concern  . Not on file  Social History Narrative   Presently resides in a senior living facility. Married three times. Third wife deceased. Has 1 child and two grandchildren. 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     ALLERGIES: Alphagan [brimonidine]  MEDICATIONS:  Current Outpatient Medications  Medication Sig Dispense Refill  . amLODipine (NORVASC) 5 MG tablet Take 1 tablet (5 mg total) by mouth daily. 90 tablet 2  . bicalutamide (CASODEX) 50 MG tablet Take 50 mg by mouth daily.    . clotrimazole-betamethasone (LOTRISONE) cream     . dorzolamide-timolol (COSOPT) 22.3-6.8 MG/ML ophthalmic solution Place 1 drop into both eyes 2 (two) times daily.    . fluorometholone (FML) 0.1 % ophthalmic suspension Place 1 drop into both eyes daily.    . furosemide (LASIX) 20 MG tablet Take 1 tablet (20 mg total) by mouth daily. 90 tablet 2  . hydrochlorothiazide (HYDRODIURIL) 12.5 MG tablet Take 1 tablet (12.5 mg total) by mouth daily. 90 tablet 3  . latanoprost (XALATAN) 0.005 % ophthalmic solution Apply 1 drop to eye daily.    Marland Kitchen lisinopril (PRINIVIL,ZESTRIL) 20 MG tablet Take 1 tablet (20 mg total) by mouth daily. 90 tablet 2  . oxybutynin (DITROPAN) 5 MG tablet Take 5 mg by mouth 3 (three) times daily.    . pravastatin (PRAVACHOL) 40 MG tablet Take 1 tablet (40 mg total) by mouth daily. 90 tablet 2  . warfarin (COUMADIN) 5 MG tablet TAKE 1 TABLET BY MOUTH EVERY DAY 20 tablet 2   No current facility-administered medications for this encounter.     REVIEW OF  SYSTEMS:  On review of systems, the patient reports that he is doing well overall. He denies any chest pain, shortness of breath, cough, fevers, chills, night sweats, unintended weight changes. He denies any bowel disturbances, and denies abdominal pain, nausea or vomiting. He denies any new musculoskeletal or joint aches or pains. He reports gross hematuria, swollen penile head, and urinary incontinence requiring a pad. A complete review of systems is obtained and is otherwise negative.    PHYSICAL EXAM:  Wt Readings from Last 3 Encounters:  05/09/18 225 lb (102.1 kg)  03/26/18 225 lb (102.1 kg)  03/11/18 225 lb (102.1 kg)   Temp Readings from Last 3 Encounters:  No data found for Temp   BP Readings from  Last 3 Encounters:  03/26/18 (!) 145/54  02/05/18 (!) 136/50   Pulse Readings from Last 3 Encounters:  03/26/18 77  02/05/18 67   Pain Assessment Pain Score: 0-No pain/10  Unable to assess due to telephone consult format.  KPS = 90  100 - Normal; no complaints; no evidence of disease. 90   - Able to carry on normal activity; minor signs or symptoms of disease. 80   - Normal activity with effort; some signs or symptoms of disease. 39   - Cares for self; unable to carry on normal activity or to do active work. 60   - Requires occasional assistance, but is able to care for most of his personal needs. 50   - Requires considerable assistance and frequent medical care. 44   - Disabled; requires special care and assistance. 85   - Severely disabled; hospital admission is indicated although death not imminent. 11   - Very sick; hospital admission necessary; active supportive treatment necessary. 10   - Moribund; fatal processes progressing rapidly. 0     - Dead  Karnofsky DA, Abelmann WH, Craver LS and Burchenal JH [phone]) The use of the nitrogen mustards in the palliative treatment of carcinoma: with particular reference to bronchogenic carcinoma Cancer 1 634-56  LABORATORY DATA:   No results found for: WBC, HGB, HCT, MCV, PLT No results found for: NA, K, CL, CO2 No results found for: ALT, AST, GGT, ALKPHOS, BILITOT   RADIOGRAPHY: No results found.    IMPRESSION/PLAN: 1. 78 y.o. 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 Today we reviewed the findings and workup thus far.  We discussed the natural history of metastatic prostate cancer.  We reviewed the the implications of positive margins, extracapsular extension, and lymph node involvement on the risk of prostate cancer recurrence.  We discussed focused stereotactic radiation treatment directed to the pelvic side wall nodes with regard to the logistics and delivery of external beam radiation treatment. We reviewed the potential acute and late side effects associated with this treatment.  He was encouraged to ask questions that were answered to his stated satisfaction.  At the end of the conversation the patient is interested in moving forward with a 5 fraction course of stereotactic body radiation (SBRT) directed at the left pelvic side wall nodes. He is scheduled for simulation on 05/16/2018 at 2:30 pm and we sign formal written consent at that time. We will share our discussion with Drs. Lorre Nick and move forward with treatment planning in anticipation of beginning his SBRT  in the near future.   Given current concerns for patient exposure during the COVID-19 pandemic, this encounter was conducted via telephone. The patient was notified in advance and was offered a [place] meeting to allow for face to face communication but unfortunately reported that he did not have the appropriate resources/technology to support such a visit and instead preferred to proceed with telephone consult. The patient has given verbal consent for this type of encounter. The time spent during this encounter was 30 minutes. The  attendants for this meeting include Tyler Pita MD, Ashlyn Bruning PA-C, [place], and patient Derren Suydam. During the encounter, Tyler Pita MD, Ashlyn Bruning PA-C, and scribe, Wilburn Mylar were located at [place].  Patient, Zebulen Simonis was located at home.    Nicholos Johns, PA-C  Tyler Pita, MD  [facility]  Radiation Oncology Direct Dial: [phone]  Fax: [phone] [email or web address]  Skype  LinkedIn   This document serves as a record of services personally performed by Tyler Pita, MD and Freeman Caldron, PA-C. It was created on their behalf by Wilburn Mylar, a trained medical scribe. The creation of this record is based on the scribe's personal observations and the provider's statements to them. This document has been checked and approved by the attending provider.

## 2018-05-11 DIAGNOSIS — C779 Secondary and unspecified malignant neoplasm of lymph node, unspecified: Secondary | ICD-10-CM | POA: Insufficient documentation

## 2018-05-12 ENCOUNTER — Telehealth: Payer: Self-pay

## 2018-05-12 NOTE — Telephone Encounter (Signed)

## 2018-05-13 ENCOUNTER — Other Ambulatory Visit: Payer: Self-pay

## 2018-05-13 ENCOUNTER — Ambulatory Visit (INDEPENDENT_AMBULATORY_CARE_PROVIDER_SITE_OTHER): Payer: Medicare HMO

## 2018-05-13 DIAGNOSIS — Z7901 Long term (current) use of anticoagulants: Secondary | ICD-10-CM

## 2018-05-13 DIAGNOSIS — I482 Chronic atrial fibrillation, unspecified: Secondary | ICD-10-CM | POA: Diagnosis not present

## 2018-05-13 LAB — POCT INR: INR: 2.6 (ref 2.0–3.0)

## 2018-05-13 NOTE — Patient Instructions (Signed)
Description   Spoke with pt and instructed pt to continue taking 1/2 tablet daily except 1 tablet on Sundays.   Recheck in 4 weeks. Call us with any medication changes or concerns # [phone]

## 2018-05-16 ENCOUNTER — Telehealth: Payer: Self-pay | Admitting: Medical Oncology

## 2018-05-16 ENCOUNTER — Other Ambulatory Visit: Payer: Self-pay

## 2018-05-16 ENCOUNTER — Ambulatory Visit
Admission: RE | Admit: 2018-05-16 | Discharge: 2018-05-16 | Disposition: A | Discharge status: Home / Self Care | Payer: Medicare HMO | Source: Ambulatory Visit | Attending: Radiation Oncology | Admitting: Radiation Oncology

## 2018-05-16 DIAGNOSIS — Z510 Encounter for antineoplastic radiation therapy: Secondary | ICD-10-CM | POA: Insufficient documentation

## 2018-05-16 DIAGNOSIS — C61 Malignant neoplasm of prostate: Secondary | ICD-10-CM | POA: Diagnosis not present

## 2018-05-16 NOTE — Telephone Encounter (Signed)
Spoke with Thomas Russell to introduce myself as the prostate nurse navigator and discuss my role. I was unable to meet him 5/8 due to consult with Dr. Tammi Klippel was held via telephone. He states the consult went well and he is hoping for great results from the radiation. He states he had radiation after his prostatectomy so this is not new. He is scheduled today for CT simulation at 2:30 pm. We reviewed the  planning session and the location of the [place]. I asked him to call me with questions or concerns and he voiced understanding.

## 2018-05-16 NOTE — Progress Notes (Signed)
  Radiation Oncology         [phone] ________________________________  Name: Thomas Russell MRN: [id number]  Date: 05/16/2018  DOB: [birth year]  STEREOTACTIC BODY RADIOTHERAPY SIMULATION AND TREATMENT PLANNING NOTE    ICD-10-CM   1. Prostate cancer ([place]) C61     DIAGNOSIS:  78 y.o. gentleman with oligometastatic disease to left pelvis from Stage pT4N1, Gleason 4+5, adenocarcinoma of the prostate with current post-op PSA of 0.3.  NARRATIVE:  The patient was brought to the [place].  Identity was confirmed.  All relevant records and images related to the planned course of therapy were reviewed.  The patient freely provided informed written consent to proceed with treatment after reviewing the details related to the planned course of therapy. The consent form was witnessed and verified by the simulation staff.  Then, the patient was set-up in a stable reproducible  supine position for radiation therapy.  A BodyFix immobilization pillow was fabricated for reproducible positioning.  Surface markings were placed.  The CT images were loaded into the planning software.  The gross target volumes (GTV) and planning target volumes (PTV) were delinieated, and avoidance structures were contoured.  Treatment planning then occurred.  The radiation prescription was entered and confirmed.  A total of two complex treatment devices were fabricated in the form of the BodyFix immobilization pillow and a neck accuform cushion.  I have requested : 3D Simulation  I have requested a DVH of the following structures: targets and all normal structures near the target including bladder, rectum, bowel and target as noted on the radiation plan to maintain doses in adherence with established limits  PLAN:  The patient will receive 40 Gy in 5 fractions.  ________________________________  Sheral Apley Tammi Klippel, M.D.

## 2018-05-27 ENCOUNTER — Ambulatory Visit: Payer: Medicare HMO | Admitting: Radiation Oncology

## 2018-05-28 ENCOUNTER — Ambulatory Visit: Payer: Medicare HMO

## 2018-05-29 ENCOUNTER — Ambulatory Visit: Payer: Medicare HMO | Admitting: Radiation Oncology

## 2018-05-29 DIAGNOSIS — Z510 Encounter for antineoplastic radiation therapy: Secondary | ICD-10-CM | POA: Diagnosis not present

## 2018-05-29 DIAGNOSIS — C61 Malignant neoplasm of prostate: Secondary | ICD-10-CM | POA: Diagnosis not present

## 2018-05-30 ENCOUNTER — Ambulatory Visit: Payer: Medicare HMO

## 2018-06-02 ENCOUNTER — Telehealth: Payer: Self-pay | Admitting: Radiation Oncology

## 2018-06-02 ENCOUNTER — Ambulatory Visit: Payer: Medicare HMO

## 2018-06-02 NOTE — Telephone Encounter (Signed)
Phoned patient's son. No answer. Left voicemail message with my direct line. Will try back at later time.

## 2018-06-02 DEATH — deceased

## 2018-06-03 ENCOUNTER — Ambulatory Visit: Payer: Medicare HMO | Admitting: Radiation Oncology

## 2018-06-03 ENCOUNTER — Ambulatory Visit: Payer: Self-pay | Admitting: Cardiology

## 2018-06-03 DIAGNOSIS — Z7901 Long term (current) use of anticoagulants: Secondary | ICD-10-CM

## 2018-06-03 DIAGNOSIS — I482 Chronic atrial fibrillation, unspecified: Secondary | ICD-10-CM

## 2018-06-04 ENCOUNTER — Ambulatory Visit: Payer: Medicare HMO

## 2018-06-05 ENCOUNTER — Ambulatory Visit: Payer: Medicare HMO | Admitting: Radiation Oncology

## 2018-06-06 ENCOUNTER — Ambulatory Visit: Payer: Medicare HMO

## 2018-06-09 ENCOUNTER — Ambulatory Visit: Payer: Medicare HMO

## 2018-06-10 ENCOUNTER — Ambulatory Visit: Payer: Medicare HMO

## 2018-06-11 ENCOUNTER — Ambulatory Visit: Payer: Medicare HMO

## 2018-06-16 NOTE — Progress Notes (Deleted)
{Choose 1 Note Type (Telehealth Visit or Telephone Visit):[phone]}   Date:  06/17/2018   ID:  Thomas, Russell 11-Jan-1940, MRN [id number]  Patient Location: Home Provider Location: Office  PCP:  Lind Guest, NP  Cardiologist:  Shirlee More, MD  Electrophysiologist:  None   Evaluation Performed:  Follow-Up Visit  Chief Complaint:  ***  History of Present Illness:    Thomas Russell is a 78 y.o. male with a history in  May 2017 he presented to the hospital in pulmonary edema rapid atrial fibrillation had non-ST elevation MI and had severely reduced ejection fraction coronary angiography showed multivessel CAD what was described as 50% left main proximal LAD and severe proximal right coronary artery he had PCI and stent to the right coronary. Echocardiogram May 2018 shows an ejection fraction of 25 to 30% moderately to severely dilated left ventricle.  He was last seen 03/26/2018 prior to that visit echocardiogram showed an ejection fraction improved 45 to 50% moderately dilated left ventricle with moderate concentric LVH.  Other findings included mild to moderate aortic and mitral regurgitation and a sending aorta was dilated 46 mm.   The patient {does/does not:200015} have symptoms concerning for COVID-19 infection (fever, chills, cough, or new shortness of breath).    Past Medical History:  Diagnosis Date  . Aortic atherosclerosis ([place]) 01/31/2018  . CAD (coronary artery disease) 01/31/2018  . Glaucoma   . Hyperlipidemia 01/31/2018  . Hypertension 01/31/2018  . Prostate cancer ([place]) 01/31/2018   Past Surgical History:  Procedure Laterality Date  . CORNEAL TRANSPLANT Bilateral   . CORONARY ANGIOPLASTY WITH STENT PLACEMENT    . PROSTATECTOMY  2017  . VEIN SURGERY       No outpatient medications have been marked as taking for the 06/17/18 encounter (Appointment) with Richardo Priest, MD.     Allergies:   Alphagan [brimonidine]   Social History   Tobacco Use  . Smoking  status: Former Research scientist (life sciences)  . Smokeless tobacco: Former Network engineer Use Topics  . Alcohol use: Not Currently  . Drug use: Never     Family Hx: The patient's family history includes Cancer in his sister; Heart attack in his father and mother.  ROS:   Please see the history of present illness.    *** All other systems reviewed and are negative.   Prior CV studies:   The following studies were reviewed today:  Echo 03/11/18: 1. The left ventricle has mildly reduced systolic function, with an ejection fraction of 45 to 50%. The cavity size was moderately dilated. There is moderate concentric left ventricular hypertrophy. Left ventricular diastolic function could not be  evaluated secondary to atrial fibrillation. Left ventricular diffuse hypokinesis.  2. The right ventricle has normal systolic function. The cavity was normal. There is no increase in right ventricular wall thickness.  3. Left atrial size was severely dilated.  4. The mitral valve is degenerative. Mild thickening of the mitral valve leaflet. There is mild mitral annular calcification present. Mitral valve regurgitation is mild to moderate by color flow Doppler. No evidence of mitral valve stenosis.  5. Tricuspid valve regurgitation is moderate.  6. The aortic valve is tricuspid Mild thickening of the aortic valve Aortic valve regurgitation is mild to moderate by color flow Doppler. no stenosis of the aortic valve.  7. Pulmonic valve regurgitation is mild by color flow Doppler.  8. 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  Lexiscan 03/11/18: There was no ST  segment deviation noted during stress. Defect 1: There is a medium defect of moderate severity present in the mid inferolateral, apical inferior and apical lateral location. Findings consistent with ischemia. This is an intermediate risk study. Ejection fraction was not estimated as the study could not be gated because of atrial fibrillation and  frequent PVCs causing irregular heartbeat. Ventricular couplets and occasional triplet were seen during stress.    Labs/Other Tests and Data Reviewed:    EKG:  An ECG dated 02/05/18 was personally reviewed today and demonstrated:  atrial fibrillation rate 62 with PVC  Recent Labs: No results found for requested labs within last 8760 hours.   Recent Lipid Panel No results found for: CHOL, TRIG, HDL, CHOLHDL, LDLCALC, LDLDIRECT  Wt Readings from Last 3 Encounters:  05/09/18 225 lb (102.1 kg)  03/26/18 225 lb (102.1 kg)  03/11/18 225 lb (102.1 kg)     Objective:    Vital Signs:  There were no vitals taken for this visit.   {[facility] Virtual Exam (Optional):[phone]::"VITAL SIGNS:  reviewed"}  ASSESSMENT & PLAN:    1. ***  COVID-19 Education: The signs and symptoms of COVID-19 were discussed with the patient and how to seek care for testing (follow up with PCP or arrange E-visit).  ***The importance of social distancing was discussed today.  Time:   Today, I have spent *** minutes with the patient with telehealth technology discussing the above problems.     Medication Adjustments/Labs and Tests Ordered: Current medicines are reviewed at length with the patient today.  Concerns regarding medicines are outlined above.   Tests Ordered: No orders of the defined types were placed in this encounter.   Medication Changes: No orders of the defined types were placed in this encounter.   Follow Up:  {F/U Format:[phone]} {follow up:15908}  Signed, Shirlee More, MD  06/17/2018 8:58 AM    [facility]

## 2018-06-17 ENCOUNTER — Telehealth: Payer: Medicare HMO | Admitting: Cardiology

## 2018-07-29 ENCOUNTER — Encounter: Payer: Self-pay | Admitting: Radiation Oncology

## 2018-07-29 NOTE — Progress Notes (Signed)
  Radiation Oncology         [phone] ________________________________  Name: Thomas Russell MRN: [id number]  Date: 07/29/2018  DOB: [birth year]  End of Treatment Note  Diagnosis:   [name] was set up to receive palliative radiotherapy for oligometastatic disease to left pelvis from Stage pT4N1, Gleason 4+5, adenocarcinoma of the prostate with current post-op PSA of 0.3.  He expired prior to any treatment.  ________________________________  Sheral Apley. Tammi Klippel, M.D.

## 2020-01-21 IMAGING — CT NUCLEAR MEDICINE  NOPR SKULL BASE TO THIGH
8 series · 16 of 16 positions shown · non-contrast
Comparison: None.

CLINICAL DATA: Prostate carcinoma.  Previous prostatectomy.

EXAM:
NUCLEAR MEDICINE PET SKULL BASE TO THIGH
TECHNIQUE: 10.6 mCi F-18 Fluciclovine was injected intravenously. Full-ring PET
imaging was performed from the skull base to thigh after the
radiotracer. CT data was obtained and used for attenuation
correction and anatomic localization.

[Series 3: pet sk_thigh ac · axial · 5.0mm · 4.07mm/px · z∈[+196,+1180]mm · 3 of 247 slices shown]
[im 1/247]
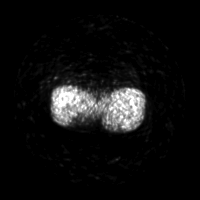
[im 124/247]
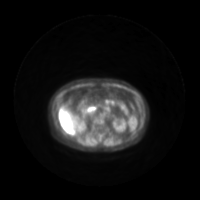
[im 247/247]
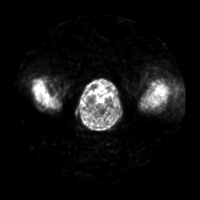

[Series 4: ct sk_thigh 5.0 hd_fov · axial · 1.07mm/px · z∈[+196,+1180]mm · 3 of 247 slices shown]
[im 1/247  soft-tissue]
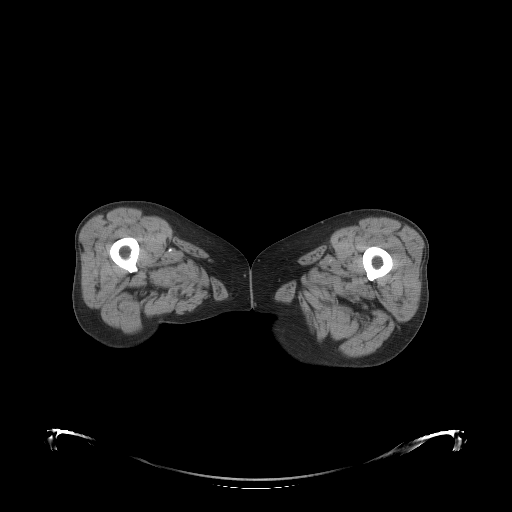
[im 124/247  soft-tissue]
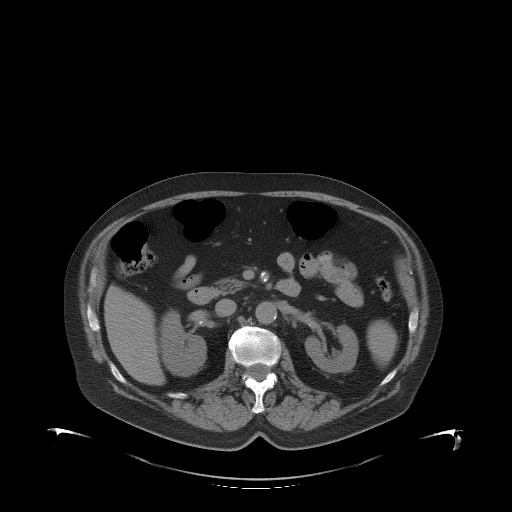
[im 247/247  soft-tissue]
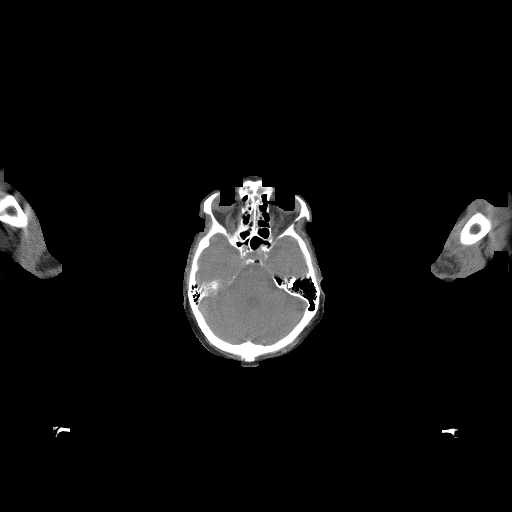

[Series 5: pet sk_thigh nac · axial · 5.0mm · 4.07mm/px · z∈[+196,+1180]mm · 3 of 247 slices shown]
[im 1/247]
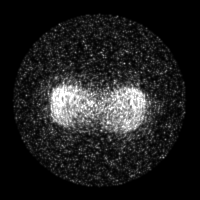
[im 124/247]
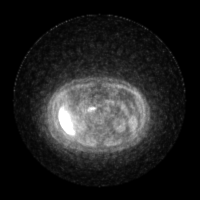
[im 247/247]
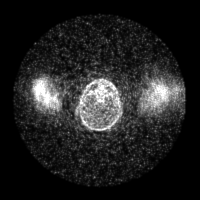

[Series 8: ct sk_thigh 5.0 (id) lung_bone · axial · 0.76mm/px · 1 of 60 slices shown]
[im 1/60  soft-tissue]
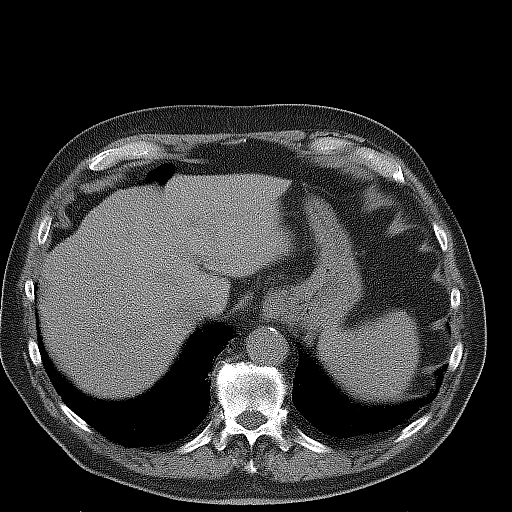

[Series 603: range-ct sk_thigh 5.0 hd_fov-cor-<alpha range> · 1 of 42 slices shown]
[im 1/42]
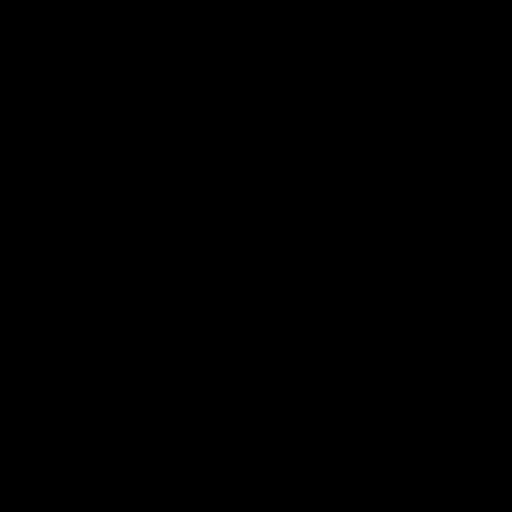

[Series 604: mip range · coronal · 2.04mm/px · 1 of 32 slices shown]
[im 1/32]
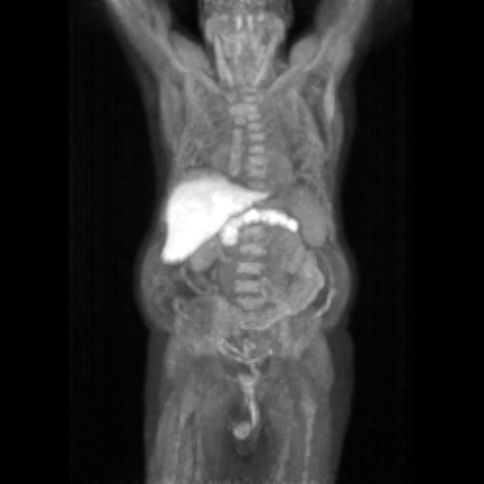

[Series 605: range-ct sk_thigh 5.0 hd_fov-tra-<alpha range> · 3 of 241 slices shown]
[im 1/241]
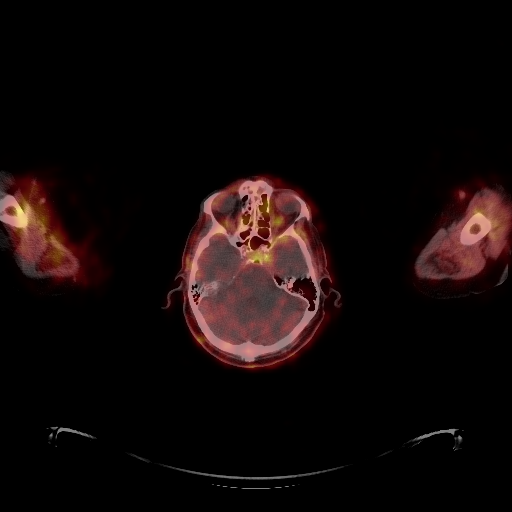
[im 121/241]
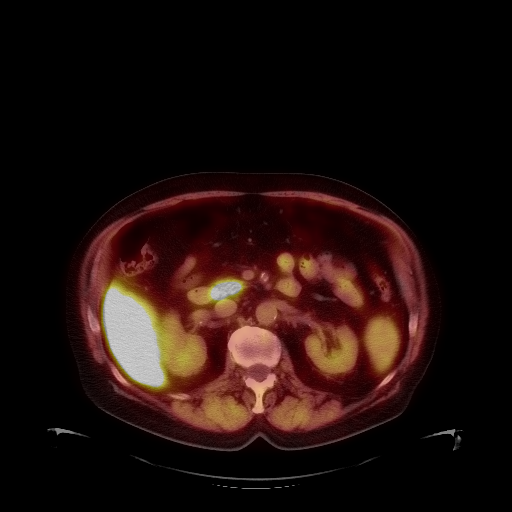
[im 241/241]
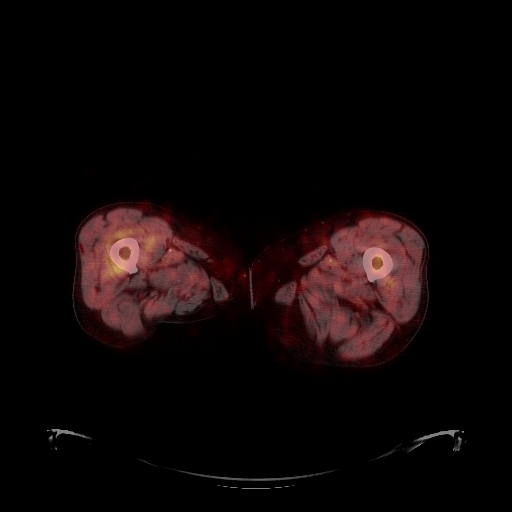

[Series 1056: results mm oncology reading · 1.1mm · 1.10mm/px · 1 of 5 slices shown]
[im 1/5]
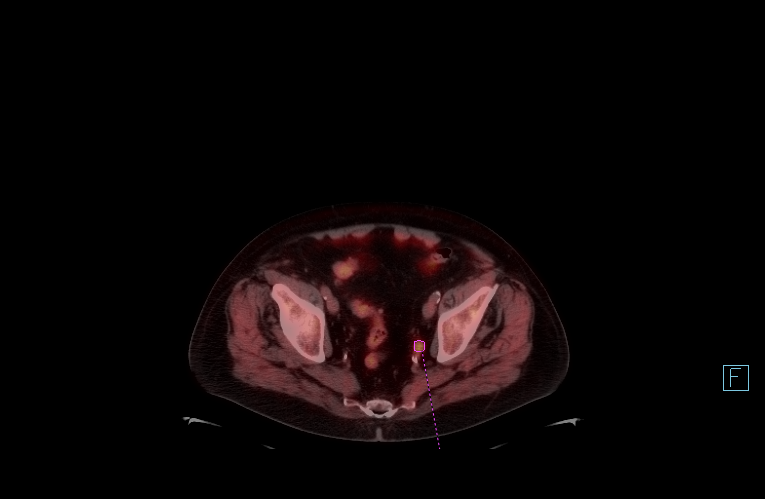

[16 of 16 positions shown; findings below may reference images not displayed]

FINDINGS: NECK

No radiotracer activity in neck lymph nodes.

Incidental CT finding: None

CHEST

No radiotracer accumulation within mediastinal or hilar lymph nodes.
No suspicious pulmonary nodules on the CT scan.

Incidental CT finding: Mild-to-moderate cardiomegaly. Aortic and
coronary artery atherosclerosis.

ABDOMEN/PELVIS

Prostate: Postop changes from prostatectomy. No focal activity in
the prostate bed.

Lymph nodes: A 1.3 cm left internal iliac lymph node is seen on
image 180/4 which has SUV max of 5.0. A 7 mm left external iliac
lymph node is seen on image 175/4, which has SUV max of 2.9. Both of
these have uptake greater than bone marrow activity, and consistent
with lymph node metastases.

Liver: No evidence of liver metastasis

Incidental CT finding: Tiny left renal calculus versus vascular
calcification. Aortic atherosclerosis. Diverticulosis is seen mainly
involving the sigmoid colon, however there is no evidence of
diverticulitis.

SKELETON

No focal  activity to suggest skeletal metastasis.
IMPRESSION: 1.3 cm left internal iliac and 7 mm left external iliac lymph nodes
with uptake greater than bone marrow activity, consistent with
metastatic disease.

No other sites of metastatic disease identified.
